# Patient Record
Sex: Male | Born: 1955 | Hispanic: No | Marital: Married | Smoking: Never smoker
Health system: Southern US, Community
[De-identification: ages and names within clinical notes are randomized; demographics above are authoritative.]

## PROBLEM LIST (undated history)

## (undated) DIAGNOSIS — I714 Abdominal aortic aneurysm, without rupture, unspecified: Secondary | ICD-10-CM

## (undated) DIAGNOSIS — I10 Essential (primary) hypertension: Secondary | ICD-10-CM

## (undated) DIAGNOSIS — E785 Hyperlipidemia, unspecified: Secondary | ICD-10-CM

## (undated) DIAGNOSIS — N200 Calculus of kidney: Secondary | ICD-10-CM

## (undated) HISTORY — PX: KNEE SURGERY: SHX244

## (undated) HISTORY — DX: Essential (primary) hypertension: I10

## (undated) HISTORY — PX: LITHOTRIPSY: SUR834

## (undated) HISTORY — PX: PROSTATE SURGERY: SHX751

## (undated) HISTORY — DX: Calculus of kidney: N20.0

## (undated) HISTORY — DX: Abdominal aortic aneurysm, without rupture, unspecified: I71.40

## (undated) HISTORY — DX: Hyperlipidemia, unspecified: E78.5

## (undated) HISTORY — DX: Abdominal aortic aneurysm, without rupture: I71.4

## (undated) HISTORY — PX: ACHILLES TENDON SURGERY: SHX542

---

## 1997-09-21 ENCOUNTER — Ambulatory Visit (HOSPITAL_BASED_OUTPATIENT_CLINIC_OR_DEPARTMENT_OTHER): Admission: RE | Admit: 1997-09-21 | Discharge: 1997-09-21 | Payer: Self-pay | Admitting: Orthopaedic Surgery

## 2015-07-02 DIAGNOSIS — E669 Obesity, unspecified: Secondary | ICD-10-CM | POA: Insufficient documentation

## 2015-10-29 DIAGNOSIS — R739 Hyperglycemia, unspecified: Secondary | ICD-10-CM | POA: Insufficient documentation

## 2016-08-11 DIAGNOSIS — R1013 Epigastric pain: Secondary | ICD-10-CM | POA: Insufficient documentation

## 2016-10-22 DIAGNOSIS — N179 Acute kidney failure, unspecified: Secondary | ICD-10-CM | POA: Insufficient documentation

## 2017-12-15 ENCOUNTER — Encounter: Payer: Self-pay | Admitting: Cardiology

## 2017-12-15 ENCOUNTER — Ambulatory Visit (INDEPENDENT_AMBULATORY_CARE_PROVIDER_SITE_OTHER): Payer: BC Managed Care – PPO | Admitting: Cardiology

## 2017-12-15 VITALS — BP 132/88 | HR 63 | Ht 71.0 in | Wt 263.0 lb

## 2017-12-15 DIAGNOSIS — R55 Syncope and collapse: Secondary | ICD-10-CM | POA: Diagnosis not present

## 2017-12-15 DIAGNOSIS — E785 Hyperlipidemia, unspecified: Secondary | ICD-10-CM | POA: Insufficient documentation

## 2017-12-15 DIAGNOSIS — I10 Essential (primary) hypertension: Secondary | ICD-10-CM | POA: Diagnosis not present

## 2017-12-15 DIAGNOSIS — R002 Palpitations: Secondary | ICD-10-CM | POA: Diagnosis not present

## 2017-12-15 DIAGNOSIS — R079 Chest pain, unspecified: Secondary | ICD-10-CM

## 2017-12-15 LAB — BASIC METABOLIC PANEL
BUN/Creatinine Ratio: 10 (ref 10–24)
BUN: 13 mg/dL (ref 8–27)
CALCIUM: 9.4 mg/dL (ref 8.6–10.2)
CO2: 25 mmol/L (ref 20–29)
Chloride: 103 mmol/L (ref 96–106)
Creatinine, Ser: 1.29 mg/dL — ABNORMAL HIGH (ref 0.76–1.27)
GFR calc Af Amer: 67 mL/min/{1.73_m2} (ref >59)
GFR calc non Af Amer: 58 mL/min/{1.73_m2} — ABNORMAL LOW (ref >59)
Glucose: 99 mg/dL (ref 65–99)
POTASSIUM: 4.8 mmol/L (ref 3.5–5.2)
Sodium: 141 mmol/L (ref 134–144)

## 2017-12-15 LAB — TROPONIN I

## 2017-12-15 MED ORDER — METOPROLOL TARTRATE 50 MG PO TABS: 50.0000 mg | ORAL_TABLET | Freq: Once | ORAL | 0 refills | Status: DC

## 2017-12-15 NOTE — Addendum Note (Signed)
Addended by: Lamona CurlOUTH, NICOLE H on: 12/15/2017 03:14 PM   Modules accepted: Orders

## 2017-12-15 NOTE — Patient Instructions (Signed)
Medication Instructions:  Your physician recommends that you continue on your current medications as directed. Please refer to the Current Medication list given to you today.   Labwork: Your physician recommends that you have the following labs drawn: BMP and Troponin   Testing/Procedures: You had an EKG  Your physician has recommended that you wear a holter monitor. Holter monitors are medical devices that record the heart's electrical activity. Doctors most often use these monitors to diagnose arrhythmias. Arrhythmias are problems with the speed or rhythm of the heartbeat. The monitor is a small, portable device. You can wear one while you do your normal daily activities. This is usually used to diagnose what is causing palpitations/syncope (passing out).  48 HR HM  Your physician has requested that you have cardiac CT. Cardiac computed tomography (CT) is a painless test that uses an x-ray machine to take clear, detailed pictures of your heart. For further information please visit [email or web address]. Please follow instruction sheet as given.  [facility] at xx:xx AM (30-45 minutes prior to test start time)  [facility]  [address] [place], [place] [address] [phone]  Proceed to the [facility] Radiology Department (First Floor).  Please follow these instructions carefully (unless otherwise directed):  Hold all erectile dysfunction medications at least 48 hours prior to test.  On the Night Before the Test: . Drink plenty of water. . Do not consume any caffeinated/decaffeinated beverages or chocolate 12 hours prior to your test. . Do not take any antihistamines 12 hours prior to your test.   On the Day of the Test: . Drink plenty of water. Do not drink any water within one hour of the test. . Do not eat any food 4 hours prior to the test. . You may take your regular medications prior to the test. . IF  NOT ON A BETA BLOCKER - Take 50 mg of lopressor (metoprolol) one hour before the test.   After the Test: . Drink plenty of water. . After receiving IV contrast, you may experience a mild flushed feeling. This is normal. . On occasion, you may experience a mild rash up to 24 hours after the test. This is not dangerous. If this occurs, you can take Benadryl 25 mg and increase your fluid intake. . If you experience trouble breathing, this can be serious. If it is severe call 911 IMMEDIATELY. If it is mild, please call our office. . If you take any of these medications: Glipizide/Metformin, Avandament, Glucavance, please do not take 48 hours after completing test.      Follow-Up: Your physician recommends that you schedule a follow-up appointment in: 6 weeks.  Any Other Special Instructions Will Be Listed Below (If Applicable).     If you need a refill on your cardiac medications before your next appointment, please call your pharmacy.

## 2017-12-15 NOTE — Progress Notes (Addendum)
Cardiology Office Note:    Date:  12/15/2017   ID:  Steven Hunter, DOB 04/19/1956, MRN [id number]  PCP:  Loyal JacobsonKalish, Michael, MD  Cardiologist:  Norman HerrlichBrian Munley, MD   Referring MD: No ref. provider found  ASSESSMENT:    1. Essential hypertension   2. Hyperlipidemia, unspecified hyperlipidemia type   3. Chest pain in adult   4. Palpitation   5. Near syncope    PLAN:    In order of problems listed above:  1. Worsened with symptomatic hypotension in the setting of weight loss sodium restriction back on half dose of his antihypertensive with blood pressure target and no orthostatic shift he will continue his current dose of ACE inhibitor and beta-blocker 2.  Presently on no lipid-lowering treatment 3.  Very atypical but increased cardiovascular risk he has had a previous normal stress test performed and will undergo cardiac CTA to define if he has CAD in severity. 4.  2-day Holter monitor with frequent symptoms to define if he has arrhythmia 5.  Resolved secondary to hypotension currently on a stable dose of ACE inhibitor diuretic  Next appointment 6 weeks   Medication Adjustments/Labs and Tests Ordered: Current medicines are reviewed at length with the patient today.  Concerns regarding medicines are outlined above.  Orders Placed This Encounter  Procedures  . CT CORONARY MORPH W/CTA COR W/SCORE W/CA W/CM &/OR WO/CM  . CT CORONARY FRACTIONAL FLOW RESERVE DATA PREP  . CT CORONARY FRACTIONAL FLOW RESERVE FLUID ANALYSIS  . Troponin I  . Basic Metabolic Panel (BMET)  . Holter monitor - 48 hour   Meds ordered this encounter  Medications  . metoprolol tartrate (LOPRESSOR) 50 MG tablet    Sig: Take 1 tablet (50 mg total) by mouth once for 1 dose. Take 1 tablet 1 hour prior to Cardiac CTA    Dispense:  1 tablet    Refill:  0     Chief Complaint  Patient presents with  . Hypotension  . Palpitations  . Chest Pain  . Hypertension    History of Present Illness:    Steven Grammeserry  Ruble is a 62 y.o. male who is being seen today for the evaluation of hypotension and syncope at the request of No ref. provider found. He has no cardiology records in Epic.  He had his wife relate that he was seen in my office in the range of 4 to 5 years ago and he had a normal stress test.  She previously was an EMT tells me he had atrial fibrillation in the past but is not anticoagulated has had PVCs acute renal failure with Salmonella infection and hypertension.  Recently made a determined attempt at weight loss losing 12 pounds and associated with this he had lightheadedness and blood pressures of 90-100.  She withdrew his antihypertensive drug has restarted half the previous dose and symptoms have resolved.  On further questioning he has intermittent episodes of palpitation where he feels like his heart is rapid and irregular and complaints of chest pain.  Chest pain is chronic lasting for years constant all the time and just an aching in the left chest unassociated with activity unrelieved with rest no associated shortness of breath GI symptoms or diaphoresis.  Chest pain is not the reason that he called and scheduled visit.   Past Medical History:  Diagnosis Date  . AAA (abdominal aortic aneurysm) (HCC)   . Hyperlipidemia   . Hypertension   . Kidney calculi     Past Surgical  History:  Procedure Laterality Date  . ACHILLES TENDON SURGERY    . KNEE SURGERY Right   . LITHOTRIPSY    . PROSTATE SURGERY      Current Medications: Current Meds  Medication Sig  . lisinopril-hydrochlorothiazide (PRINZIDE,ZESTORETIC) 10-12.5 MG tablet Take 0.5 tablets by mouth daily.      Allergies:   Penicillins; Acebutolol; Sulfa antibiotics; and Tetanus toxoid, adsorbed   Social History   Socioeconomic History  . Marital status: Married    Spouse name: Not on file  . Number of children: Not on file  . Years of education: Not on file  . Highest education level: Not on file  Occupational  History  . Not on file  Social Needs  . Financial resource strain: Not on file  . Food insecurity:    Worry: Not on file    Inability: Not on file  . Transportation needs:    Medical: Not on file    Non-medical: Not on file  Tobacco Use  . Smoking status: Never Smoker  . Smokeless tobacco: Never Used  Substance and Sexual Activity  . Alcohol use: Not Currently  . Drug use: Never  . Sexual activity: Not on file  Lifestyle  . Physical activity:    Days per week: Not on file    Minutes per session: Not on file  . Stress: Not on file  Relationships  . Social connections:    Talks on phone: Not on file    Gets together: Not on file    Attends religious service: Not on file    Active member of club or organization: Not on file    Attends meetings of clubs or organizations: Not on file    Relationship status: Not on file  Other Topics Concern  . Not on file  Social History Narrative  . Not on file     Family History: The patient's family history includes Allergies in his mother; Congestive Heart Failure in his mother; Diabetes in his mother; Heart Problems in his paternal uncle; Heart attack in his father and paternal uncle; Thyroid disease in his sister.  ROS:   Review of Systems  Constitution: Positive for malaise/fatigue.  HENT: Negative.   Eyes: Negative.   Cardiovascular: Positive for chest pain, near-syncope and palpitations.  Respiratory: Positive for snoring.   Endocrine: Negative.   Hematologic/Lymphatic: Negative.   Skin: Negative.   Musculoskeletal: Negative.   Gastrointestinal: Negative.   Genitourinary: Negative.   Neurological: Positive for dizziness and headaches.  Psychiatric/Behavioral: The patient is nervous/anxious.   Allergic/Immunologic: Negative.    Please see the history of present illness.     All other systems reviewed and are negative.  EKGs/Labs/Other Studies Reviewed:    The following studies were reviewed today:   EKG:  EKG is   ordered today.  The ekg ordered today demonstrates [facility] and stable RBBB  Recent Labs: No results found for requested labs within last 8760 hours.  Recent Lipid Panel No results found for: CHOL, TRIG, HDL, CHOLHDL, VLDL, LDLCALC, LDLDIRECT  Physical Exam:    VS:  BP 132/88 (BP Location: Right Arm, Patient Position: Sitting, Cuff Size: Large)   Pulse 63   Ht 5\' 11"  (1.803 m)   Wt 263 lb (119.3 kg)   SpO2 98%   BMI 36.68 kg/m     Wt Readings from Last 3 Encounters:  12/15/17 263 lb (119.3 kg)     GEN:  Well nourished, well developed in no acute distress HEENT: Normal  NECK: No JVD; No carotid bruits LYMPHATICS: No lymphadenopathy CARDIAC: RRR, no murmurs, rubs, gallops RESPIRATORY:  Clear to auscultation without rales, wheezing or rhonchi  ABDOMEN: Soft, non-tender, non-distended MUSCULOSKELETAL:  No edema; No deformity  SKIN: Warm and dry NEUROLOGIC:  Alert and oriented x 3 PSYCHIATRIC:  Normal affect     Signed, Norman Herrlich, MD  12/15/2017 12:25 PM    [facility]

## 2017-12-21 ENCOUNTER — Ambulatory Visit (INDEPENDENT_AMBULATORY_CARE_PROVIDER_SITE_OTHER): Payer: BC Managed Care – PPO

## 2017-12-21 DIAGNOSIS — R002 Palpitations: Secondary | ICD-10-CM | POA: Diagnosis not present

## 2017-12-21 DIAGNOSIS — R55 Syncope and collapse: Secondary | ICD-10-CM | POA: Diagnosis not present

## 2018-01-26 ENCOUNTER — Other Ambulatory Visit: Payer: Self-pay | Admitting: Emergency Medicine

## 2018-01-26 ENCOUNTER — Telehealth: Payer: Self-pay | Admitting: Cardiology

## 2018-01-26 ENCOUNTER — Other Ambulatory Visit: Payer: Self-pay | Admitting: Cardiology

## 2018-01-26 DIAGNOSIS — I209 Angina pectoris, unspecified: Secondary | ICD-10-CM

## 2018-01-26 NOTE — Telephone Encounter (Signed)
Left message for patient to return call.

## 2018-01-26 NOTE — Telephone Encounter (Signed)
Patient has an appt on 8/22 for follow-up with physician. He has not had his cardiac CT done yet. Should the visit be rescheduled? Also [facility] a couple nights ago for dizziness and palpitations.

## 2018-01-27 ENCOUNTER — Telehealth: Payer: Self-pay | Admitting: Emergency Medicine

## 2018-01-27 DIAGNOSIS — I209 Angina pectoris, unspecified: Secondary | ICD-10-CM

## 2018-01-27 NOTE — Telephone Encounter (Signed)
/  Patient informed that lab work needs to be drawn before his scheduled ct. Patient plans to have labs drawn on 02-19-18.

## 2018-01-28 ENCOUNTER — Ambulatory Visit: Payer: BC Managed Care – PPO | Admitting: Cardiology

## 2018-02-20 LAB — BASIC METABOLIC PANEL
BUN/Creatinine Ratio: 13 (ref 10–24)
BUN: 15 mg/dL (ref 8–27)
CHLORIDE: 106 mmol/L (ref 96–106)
CO2: 22 mmol/L (ref 20–29)
Calcium: 9.1 mg/dL (ref 8.6–10.2)
Creatinine, Ser: 1.17 mg/dL (ref 0.76–1.27)
GFR calc Af Amer: 76 mL/min/{1.73_m2} (ref >59)
GFR calc non Af Amer: 65 mL/min/{1.73_m2} (ref >59)
GLUCOSE: 76 mg/dL (ref 65–99)
POTASSIUM: 4.1 mmol/L (ref 3.5–5.2)
SODIUM: 143 mmol/L (ref 134–144)

## 2018-02-23 ENCOUNTER — Ambulatory Visit (HOSPITAL_COMMUNITY)
Admission: RE | Admit: 2018-02-23 | Discharge: 2018-02-23 | Disposition: A | Discharge status: Home / Self Care | Payer: BC Managed Care – PPO | Source: Ambulatory Visit | Attending: Cardiology | Admitting: Cardiology

## 2018-02-23 DIAGNOSIS — I209 Angina pectoris, unspecified: Secondary | ICD-10-CM | POA: Diagnosis present

## 2018-02-23 DIAGNOSIS — R079 Chest pain, unspecified: Secondary | ICD-10-CM

## 2018-02-23 MED ORDER — METOPROLOL TARTRATE 5 MG/5ML IV SOLN
5.0000 mg | INTRAVENOUS | Status: DC | PRN
  Administered 2018-02-23: 5 mg via INTRAVENOUS

## 2018-02-23 MED ORDER — NITROGLYCERIN 0.4 MG SL SUBL
0.8000 mg | SUBLINGUAL_TABLET | Freq: Once | SUBLINGUAL | Status: AC
  Administered 2018-02-23: 0.8 mg via SUBLINGUAL
  Filled 2018-02-23: qty 25

## 2018-02-23 MED ORDER — METOPROLOL TARTRATE 5 MG/5ML IV SOLN
INTRAVENOUS | Status: AC
  Filled 2018-02-23: qty 5

## 2018-02-23 MED ORDER — NITROGLYCERIN 0.4 MG SL SUBL
SUBLINGUAL_TABLET | SUBLINGUAL | Status: AC
  Filled 2018-02-23: qty 2

## 2018-02-23 MED ORDER — IOPAMIDOL (ISOVUE-370) INJECTION 76%
100.0000 mL | Freq: Once | INTRAVENOUS | Status: AC | PRN
  Administered 2018-02-23: 90 mL via INTRAVENOUS

## 2018-02-23 NOTE — Progress Notes (Signed)
CT scan completed. Tolerated well. D/C home walking. Awake and alert. In no distress. 

## 2018-03-03 NOTE — Progress Notes (Signed)
Cardiology Office Note:    Date:  03/04/2018   ID:  Steven Hunter, DOB Jan 27, 1956, MRN [id number]  PCP:  Loyal Jacobson, MD  Cardiologist:  Norman Herrlich, MD    Referring MD: Loyal Jacobson, MD    ASSESSMENT:    1. Essential hypertension   2. Hypotension due to drugs    PLAN:    In order of problems listed above:  1. Stable home blood pressures are in range and always taking now is an isolated ACE inhibitor without diuretic or beta-blocker.  He is reassured by his cardiac CTA without CAD and normal Holter monitor. 2. Resolved continue isolators ACE inhibitor for hypertension I encouraged him to check home blood pressure sit and stand and to contact me if systolics are less than 110   Next appointment: As needed   Medication Adjustments/Labs and Tests Ordered: Current medicines are reviewed at length with the patient today.  Concerns regarding medicines are outlined above.  No orders of the defined types were placed in this encounter.  No orders of the defined types were placed in this encounter.   Chief Complaint  Patient presents with  . Follow-up  . Hypotension    History of Present Illness:    Steven Hunter is a 62 y.o. male with a hx of syncope with hypotension last seen 12/15/17. Compliance with diet, lifestyle and medications: Yes  He is pleased with the quality of his life and has had no lightheadedness chest pain dyspnea palpitation or syncope Home blood pressure sporadically runs 06/29/1928 systolic  Cardiac CT showed minimal calcification score of 4 and no CAD and Holter monitor 48 hours was unremarkable without significant arrhythmia Past Medical History:  Diagnosis Date  . AAA (abdominal aortic aneurysm) (HCC)   . Hyperlipidemia   . Hypertension   . Kidney calculi     Past Surgical History:  Procedure Laterality Date  . ACHILLES TENDON SURGERY    . KNEE SURGERY Right   . LITHOTRIPSY    . PROSTATE SURGERY      Current Medications: Current  Meds  Medication Sig  . albuterol (VENTOLIN HFA) 108 (90 Base) MCG/ACT inhaler Inhale into the lungs.  . Beclomethasone Dipropionate 80 MCG/ACT AERS Place into the nose.  . fexofenadine-pseudoephedrine (ALLEGRA-D 24) 180-240 MG 24 hr tablet Take by mouth.  Marland Kitchen lisinopril (PRINIVIL,ZESTRIL) 10 MG tablet Take by mouth.  . tamsulosin (FLOMAX) 0.4 MG CAPS capsule Take 0.4 mg by mouth as needed.      Allergies:   Penicillins; Acebutolol; Ciprofloxacin; Sulfa antibiotics; and Tetanus toxoid, adsorbed   Social History   Socioeconomic History  . Marital status: Married    Spouse name: Not on file  . Number of children: Not on file  . Years of education: Not on file  . Highest education level: Not on file  Occupational History  . Not on file  Social Needs  . Financial resource strain: Not on file  . Food insecurity:    Worry: Not on file    Inability: Not on file  . Transportation needs:    Medical: Not on file    Non-medical: Not on file  Tobacco Use  . Smoking status: Never Smoker  . Smokeless tobacco: Never Used  Substance and Sexual Activity  . Alcohol use: Not Currently  . Drug use: Never  . Sexual activity: Not on file  Lifestyle  . Physical activity:    Days per week: Not on file    Minutes per session: Not on file  .  Stress: Not on file  Relationships  . Social connections:    Talks on phone: Not on file    Gets together: Not on file    Attends religious service: Not on file    Active member of club or organization: Not on file    Attends meetings of clubs or organizations: Not on file    Relationship status: Not on file  Other Topics Concern  . Not on file  Social History Narrative  . Not on file     Family History: The patient's family history includes Allergies in his mother; Congestive Heart Failure in his mother; Diabetes in his mother; Heart Problems in his paternal uncle; Heart attack in his father and paternal uncle; Thyroid disease in his sister. ROS:     Please see the history of present illness.    All other systems reviewed and are negative.  EKGs/Labs/Other Studies Reviewed:    The following studies were reviewed today:   Recent Labs: 02/19/2018: BUN 15; Creatinine, Ser 1.17; Potassium 4.1; Sodium 143  Recent Lipid Panel No results found for: CHOL, TRIG, HDL, CHOLHDL, VLDL, LDLCALC, LDLDIRECT  Physical Exam:    VS:  BP 130/88 (BP Location: Right Arm, Patient Position: Sitting, Cuff Size: Normal)   Pulse 70   Ht 5\' 11"  (1.803 m)   Wt 250 lb (113.4 kg)   SpO2 97%   BMI 34.87 kg/m     Wt Readings from Last 3 Encounters:  03/04/18 250 lb (113.4 kg)  12/15/17 263 lb (119.3 kg)     GEN:  Well nourished, well developed in no acute distress HEENT: Normal NECK: No JVD; No carotid bruits LYMPHATICS: No lymphadenopathy CARDIAC: In the room RRR, no murmurs, rubs, gallops RESPIRATORY:  Clear to auscultation without rales, wheezing or rhonchi  ABDOMEN: Soft, non-tender, non-distended MUSCULOSKELETAL:  No edema; No deformity  SKIN: Warm and dry NEUROLOGIC:  Alert and oriented x 3 PSYCHIATRIC:  Normal affect    Signed, Norman Herrlich, MD  03/04/2018 10:18 AM    [facility]

## 2018-03-04 ENCOUNTER — Encounter: Payer: Self-pay | Admitting: Cardiology

## 2018-03-04 ENCOUNTER — Ambulatory Visit (INDEPENDENT_AMBULATORY_CARE_PROVIDER_SITE_OTHER): Payer: BC Managed Care – PPO | Admitting: Cardiology

## 2018-03-04 VITALS — BP 130/88 | HR 70 | Ht 71.0 in | Wt 250.0 lb

## 2018-03-04 DIAGNOSIS — I959 Hypotension, unspecified: Secondary | ICD-10-CM | POA: Insufficient documentation

## 2018-03-04 DIAGNOSIS — I952 Hypotension due to drugs: Secondary | ICD-10-CM

## 2018-03-04 DIAGNOSIS — I10 Essential (primary) hypertension: Secondary | ICD-10-CM | POA: Diagnosis not present

## 2018-03-04 NOTE — Patient Instructions (Addendum)
Medication Instructions:  Your physician recommends that you continue on your current medications as directed. Please refer to the Current Medication list given to you today.   Labwork: None  Testing/Procedures: None  Follow-Up: Your physician recommends that you schedule a follow-up appointment in: As needed if symptoms worsen or fail to improve.  Any Other Special Instructions Will Be Listed Below (If Applicable).     If you need a refill on your cardiac medications before your next appointment, please call your pharmacy.

## 2020-01-02 IMAGING — CT CT HEART MORP W/ CTA COR W/ SCORE W/ CA W/CM &/OR W/O CM
4 of 7 series · 8 of 20 positions shown, 9 images · IV contrast (APPLIED)
Comparison: 12/31/2010

CLINICAL DATA: Chest pain

EXAM:
Cardiac CTA
MEDICATIONS:
Sub lingual nitro.  4mg x 2 and lopressor 5mg IV
TECHNIQUE: The patient was scanned on a Siemens [REDACTED]ice scanner. Gantry
rotation speed was 250 msecs. Collimation was 0.6 mm. A 100 kV
prospective scan was triggered in the ascending thoracic aorta at
35-75% of the R-R interval. Average HR during the scan was 60 bpm.
The 3D data set was interpreted on a dedicated work station using
MPR, MIP and VRT modes. A total of 80cc of contrast was used.

[Series 6: ts syst sharp 33 % · axial · 0.36mm/px · z∈[+1097,+1138]mm · 2 of 306 slices shown]
[im 102/306  lung]
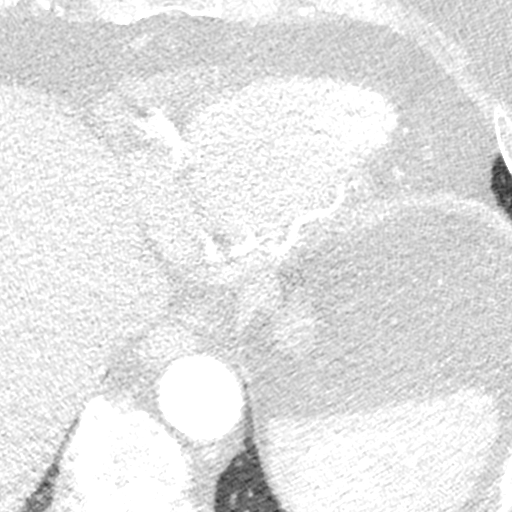
[im 204/306  lung]
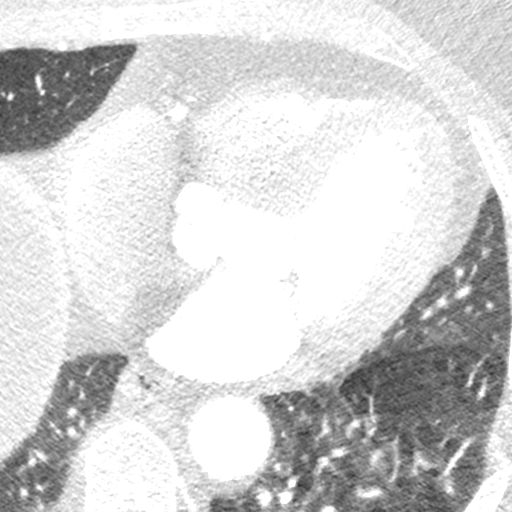

[Series 7: best diast 79 % · axial · 0.36mm/px · z∈[+1097,+1138]mm · 2 of 306 slices shown, 3 images]
[im 102/306  vessel]
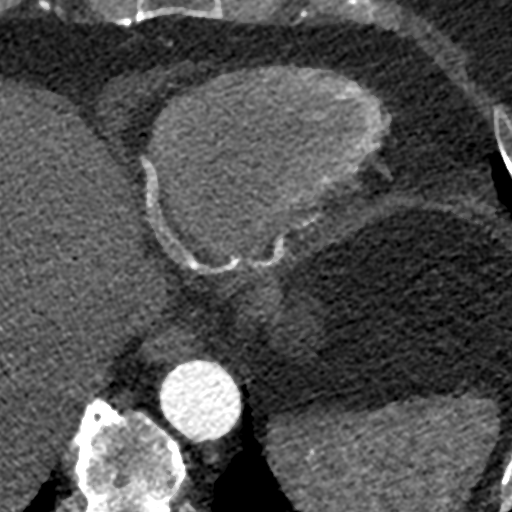
[im 102/306  lung]
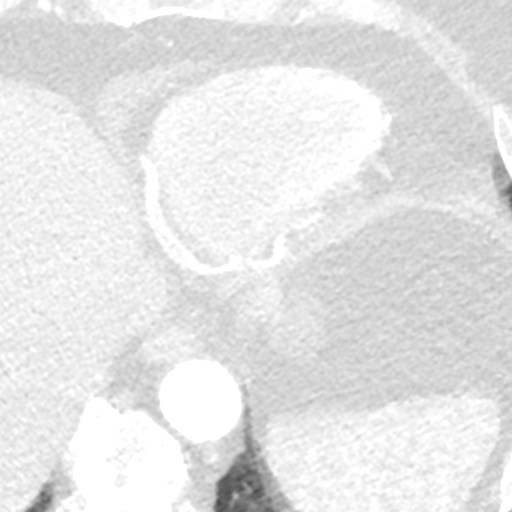
[im 204/306  vessel]
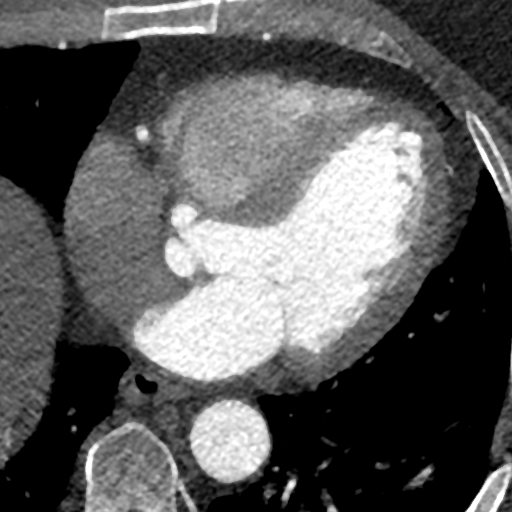

[Series 8: best syst 33 % · axial · 0.36mm/px · z∈[+1097,+1138]mm · 2 of 306 slices shown]
[im 102/306  vessel]
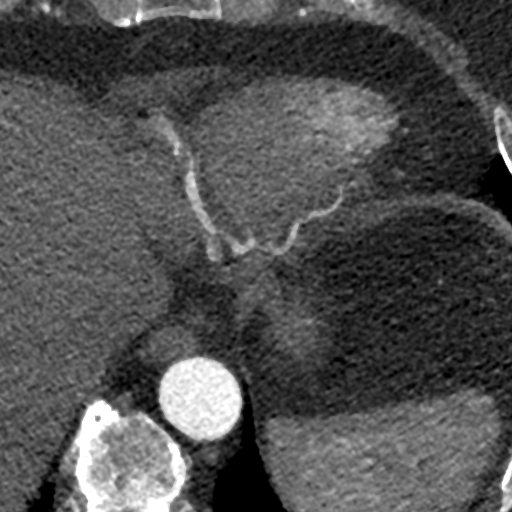
[im 204/306  vessel]
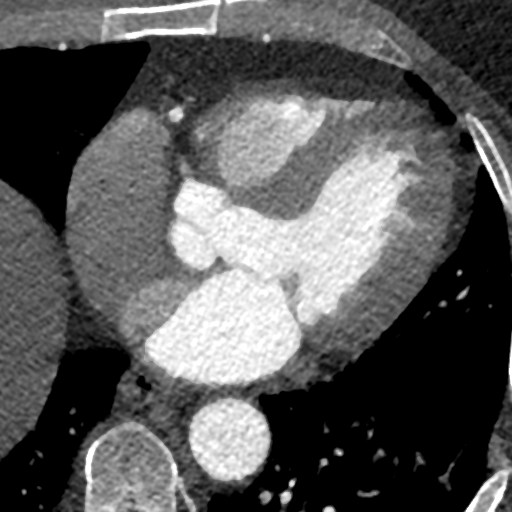

[Series 9: ts diast sharp 79 % · axial · 0.36mm/px · z∈[+1097,+1138]mm · 2 of 306 slices shown]
[im 102/306  lung]
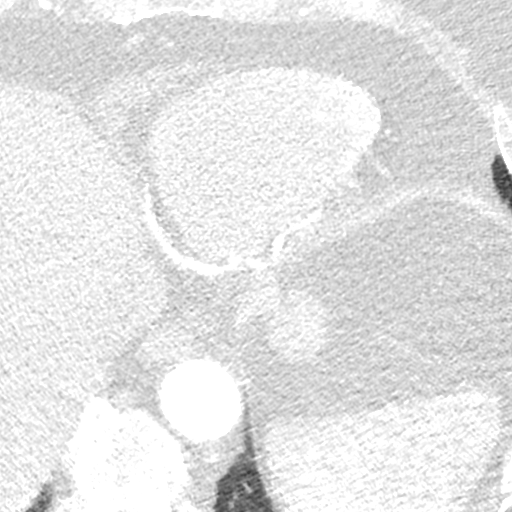
[im 204/306  lung]
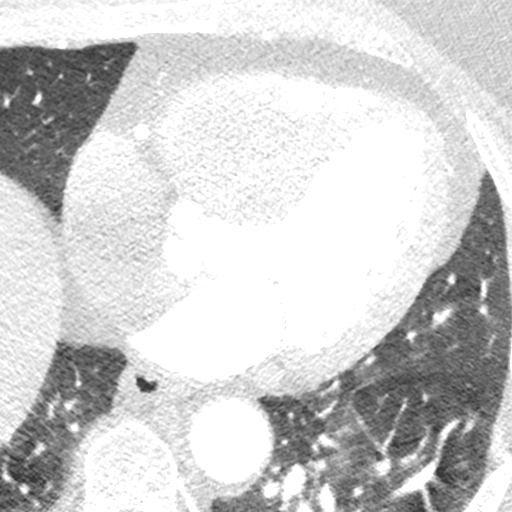

[8 of 20 positions shown; findings below may reference images not displayed]

FINDINGS: Non-cardiac: See separate report from [REDACTED].

Difficult study with respiratory artifact.

Calcium Score: 4 Agatston units.

Coronary Arteries: Right dominant with no anomalies

LM: No plaque or stenosis.

LAD system: Mixed plaque in the proximal LAD, no significant
stenosis.

Circumflex system: The LCx is involved by respiratory artifact but
no definite disease.

RCA system: The distal RCA has significant artifact present. I
suspect there is no significant disease.
IMPRESSION: 1. Coronary artery calcium score 4 Agatston units. This suggests low
risk for future cardiac events.

2. Significant respiratory artifact. However, no obstructive disease
was noted.

Hlm Drai

EXAM:
OVER-READ INTERPRETATION  CT CHEST

The following report is an over-read performed by radiologist Dr.
Nury Misas [REDACTED] on 02/23/2018. This over-read
does not include interpretation of cardiac or coronary anatomy or
pathology. The coronary CTA interpretation by the cardiologist is
attached.
FINDINGS: Vascular: Heart is normal size.  Visualized aorta is normal caliber.

Mediastinum/Nodes: No adenopathy in the lower mediastinum or hila.

Lungs/Pleura: No confluent airspace opacities or effusions.

Upper Abdomen: Imaging into the upper abdomen shows no acute
findings.

Musculoskeletal: Chest wall soft tissues are unremarkable. No acute
bony abnormality.
IMPRESSION: No acute or significant extracardiac abnormality.
